# Patient Record
Sex: Female | Born: 1960 | Race: White | Hispanic: No | Marital: Single | Smoking: Never smoker
Health system: Southern US, Community
[De-identification: ages and names within clinical notes are randomized; demographics above are authoritative.]

## PROBLEM LIST (undated history)

## (undated) DIAGNOSIS — D649 Anemia, unspecified: Secondary | ICD-10-CM

## (undated) DIAGNOSIS — R569 Unspecified convulsions: Secondary | ICD-10-CM

## (undated) HISTORY — DX: Anemia, unspecified: D64.9

## (undated) HISTORY — DX: Unspecified convulsions: R56.9

## (undated) HISTORY — PX: ABDOMINAL HYSTERECTOMY: SHX81

---

## 2014-12-28 ENCOUNTER — Ambulatory Visit (INDEPENDENT_AMBULATORY_CARE_PROVIDER_SITE_OTHER): Payer: Medicare Other | Admitting: Emergency Medicine

## 2014-12-28 VITALS — BP 118/80 | HR 73 | Temp 97.7°F | Resp 18 | Ht 63.5 in | Wt 175.0 lb

## 2014-12-28 DIAGNOSIS — N3090 Cystitis, unspecified without hematuria: Secondary | ICD-10-CM

## 2014-12-28 DIAGNOSIS — R358 Other polyuria: Secondary | ICD-10-CM | POA: Diagnosis not present

## 2014-12-28 DIAGNOSIS — E119 Type 2 diabetes mellitus without complications: Secondary | ICD-10-CM | POA: Diagnosis not present

## 2014-12-28 DIAGNOSIS — R3589 Other polyuria: Secondary | ICD-10-CM

## 2014-12-28 DIAGNOSIS — R112 Nausea with vomiting, unspecified: Secondary | ICD-10-CM | POA: Diagnosis not present

## 2014-12-28 LAB — COMPREHENSIVE METABOLIC PANEL
ALT: 34 U/L — ABNORMAL HIGH (ref 6–29)
AST: 28 U/L (ref 10–35)
Albumin: 4.1 g/dL (ref 3.6–5.1)
Alkaline Phosphatase: 98 U/L (ref 33–130)
BUN: 8 mg/dL (ref 7–25)
CALCIUM: 9.1 mg/dL (ref 8.6–10.4)
CHLORIDE: 101 mmol/L (ref 98–110)
CO2: 23 mmol/L (ref 20–31)
Creat: 0.71 mg/dL (ref 0.50–1.05)
GLUCOSE: 137 mg/dL — AB (ref 65–99)
POTASSIUM: 4.1 mmol/L (ref 3.5–5.3)
Sodium: 134 mmol/L — ABNORMAL LOW (ref 135–146)
TOTAL PROTEIN: 7.2 g/dL (ref 6.1–8.1)
Total Bilirubin: 0.7 mg/dL (ref 0.2–1.2)

## 2014-12-28 LAB — POCT GLYCOSYLATED HEMOGLOBIN (HGB A1C): HEMOGLOBIN A1C: 8.6

## 2014-12-28 LAB — POCT CBC
Granulocyte percent: 55 %G (ref 37–80)
HEMATOCRIT: 44.8 % (ref 37.7–47.9)
HEMOGLOBIN: 14.3 g/dL (ref 12.2–16.2)
Lymph, poc: 3.2 (ref 0.6–3.4)
MCH: 27.7 pg (ref 27–31.2)
MCHC: 31.9 g/dL (ref 31.8–35.4)
MCV: 86.7 fL (ref 80–97)
MID (cbc): 0.5 (ref 0–0.9)
MPV: 8.3 fL (ref 0–99.8)
POC GRANULOCYTE: 4.5 (ref 2–6.9)
POC LYMPH PERCENT: 39 %L (ref 10–50)
POC MID %: 6 % (ref 0–12)
Platelet Count, POC: 218 10*3/uL (ref 142–424)
RBC: 5.16 M/uL (ref 4.04–5.48)
RDW, POC: 13.3 %
WBC: 8.2 10*3/uL (ref 4.6–10.2)

## 2014-12-28 LAB — POC MICROSCOPIC URINALYSIS (UMFC): MUCUS RE: ABSENT

## 2014-12-28 LAB — POCT URINALYSIS DIP (MANUAL ENTRY)
Bilirubin, UA: NEGATIVE
Glucose, UA: NEGATIVE
NITRITE UA: NEGATIVE
Spec Grav, UA: >=1.03
Urobilinogen, UA: 0.2
pH, UA: 5.5

## 2014-12-28 LAB — GLUCOSE, POCT (MANUAL RESULT ENTRY): POC Glucose: 145 mg/dl — AB (ref 70–99)

## 2014-12-28 MED ORDER — PHENAZOPYRIDINE HCL 200 MG PO TABS: 200.0000 mg | ORAL_TABLET | Freq: Three times a day (TID) | ORAL | Status: DC | PRN

## 2014-12-28 MED ORDER — METFORMIN HCL ER 500 MG PO TB24: ORAL_TABLET | ORAL | Status: DC

## 2014-12-28 MED ORDER — BLOOD GLUCOSE MONITOR KIT: PACK | Status: DC

## 2014-12-28 MED ORDER — SULFAMETHOXAZOLE-TRIMETHOPRIM 800-160 MG PO TABS: 1.0000 | ORAL_TABLET | Freq: Two times a day (BID) | ORAL | Status: DC

## 2014-12-28 NOTE — Progress Notes (Signed)
Subjective:  Patient ID: Wanda Hunter, female    DOB: July 10, 1960  Age: 54 y.o. MRN: [id number]  CC: Emesis and Nausea   HPI Wanda Hunter presents  she has nausea and vomiting for the last week. Had one episode of hematemesis. No fever or chills. No stool change. No ill contacts. Does have some minor diarrhea and dysuria urgency and frequency of headaches. She been afflicted with chills  History Wanda Hunter has a past medical history of Anemia and Seizures.   She has past surgical history that includes Cesarean section and Abdominal hysterectomy.   Her  family history includes Cancer in her father; Heart disease in her maternal grandmother; Hyperlipidemia in her father and mother; Hypertension in her father and mother; Stroke in her maternal grandmother.  She   reports that she has never smoked. She does not have any smokeless tobacco history on file. She reports that she does not drink alcohol or use illicit drugs.  No outpatient prescriptions prior to visit.   No facility-administered medications prior to visit.    Social History   Social History  . Marital Status: Single    Spouse Name: N/A  . Number of Children: N/A  . Years of Education: N/A   Social History Main Topics  . Smoking status: Never Smoker   . Smokeless tobacco: None  . Alcohol Use: No  . Drug Use: No  . Sexual Activity: Not Asked   Other Topics Concern  . None   Social History Narrative  . None     Review of Systems  Constitutional: Positive for chills. Negative for fever and appetite change.  HENT: Negative for congestion, ear pain, postnasal drip, sinus pressure and sore throat.   Eyes: Negative for pain and redness.  Respiratory: Negative for cough, shortness of breath and wheezing.   Cardiovascular: Negative for leg swelling.  Gastrointestinal: Positive for nausea, vomiting and diarrhea. Negative for abdominal pain, constipation and blood in stool.  Endocrine: Negative for  polyuria.  Genitourinary: Positive for dysuria, urgency and frequency. Negative for flank pain.  Musculoskeletal: Negative for gait problem.  Skin: Negative for rash.  Neurological: Positive for headaches. Negative for weakness.  Psychiatric/Behavioral: Negative for confusion and decreased concentration. The patient is not nervous/anxious.     Objective:  BP 118/80 mmHg  Pulse 73  Temp(Src) 97.7 F (36.5 C) (Oral)  Resp 18  Ht 5' 3.5" (1.613 m)  Wt 175 lb (79.379 kg)  BMI 30.51 kg/m2  SpO2 98%  Physical Exam  Constitutional: She is oriented to person, place, and time. She appears well-developed and well-nourished. No distress.  HENT:  Head: Normocephalic and atraumatic.  Right Ear: External ear normal.  Left Ear: External ear normal.  Nose: Nose normal.  Eyes: Conjunctivae and EOM are normal. Pupils are equal, round, and reactive to light. No scleral icterus.  Neck: Normal range of motion. Neck supple. No tracheal deviation present.  Cardiovascular: Normal rate, regular rhythm and normal heart sounds.   Pulmonary/Chest: Effort normal. No respiratory distress. She has no wheezes. She has no rales.  Abdominal: She exhibits no mass. There is no tenderness. There is no rebound and no guarding.  Musculoskeletal: She exhibits no edema.  Lymphadenopathy:    She has no cervical adenopathy.  Neurological: She is alert and oriented to person, place, and time. Coordination normal.  Skin: Skin is warm and dry. No rash noted.  Psychiatric: She has a normal mood and affect. Her behavior is normal.  Assessment & Plan:   Wanda Hunter was seen today for emesis and nausea.  Diagnoses and all orders for this visit:  Non-intractable vomiting with nausea, vomiting of unspecified type -     POCT CBC -     Comprehensive metabolic panel -     POCT urinalysis dipstick -     POCT Microscopic Urinalysis ([facility]) -     POCT glucose (manual entry)  Polyuria   Wanda Hunter does not  currently have medications on file.  No orders of the defined types were placed in this encounter.    Appropriate red flag conditions were discussed with the patient as well as actions that should be taken.  Patient expressed his understanding.  Follow-up: No Follow-up on file.  Carmelina Dane, MD   Results for orders placed or performed in visit on 12/28/14  POCT CBC  Result Value Ref Range   WBC 8.2 4.6 - 10.2 K/uL   Lymph, poc 3.2 0.6 - 3.4   POC LYMPH PERCENT 39.0 10 - 50 %L   MID (cbc) 0.5 0 - 0.9   POC MID % 6.0 0 - 12 %M   POC Granulocyte 4.5 2 - 6.9   Granulocyte percent 55.0 37 - 80 %G   RBC 5.16 4.04 - 5.48 M/uL   Hemoglobin 14.3 12.2 - 16.2 g/dL   HCT, POC 16.1 09.6 - 47.9 %   MCV 86.7 80 - 97 fL   MCH, POC 27.7 27 - 31.2 pg   MCHC 31.9 31.8 - 35.4 g/dL   RDW, POC 04.5 %   Platelet Count, POC 218 142 - 424 K/uL   MPV 8.3 0 - 99.8 fL  POCT urinalysis dipstick  Result Value Ref Range   Color, UA yellow yellow   Clarity, UA cloudy (A) clear   Glucose, UA negative negative   Bilirubin, UA negative negative   Ketones, POC UA trace (5) (A) negative   Spec Grav, UA >=1.030    Blood, UA moderate (A) negative   pH, UA 5.5    Protein Ur, POC =100 (A) negative   Urobilinogen, UA 0.2    Nitrite, UA Negative Negative   Leukocytes, UA small (1+) (A) Negative  POCT Microscopic Urinalysis ([facility])  Result Value Ref Range   WBC,UR,HPF,POC Many (A) None WBC/hpf   RBC,UR,HPF,POC Moderate (A) None RBC/hpf   Bacteria Moderate (A) None   Mucus Absent Absent   Epithelial Cells, UR Per Microscopy Moderate (A) None cells/hpf  POCT glucose (manual entry)  Result Value Ref Range   POC Glucose 145 (A) 70 - 99 mg/dl  POCT glycosylated hemoglobin (Hb A1C)  Result Value Ref Range   Hemoglobin A1C 8.6

## 2014-12-28 NOTE — Patient Instructions (Signed)

## 2014-12-29 DIAGNOSIS — G4700 Insomnia, unspecified: Secondary | ICD-10-CM | POA: Diagnosis not present

## 2014-12-29 DIAGNOSIS — R42 Dizziness and giddiness: Secondary | ICD-10-CM | POA: Diagnosis not present

## 2014-12-29 DIAGNOSIS — R51 Headache: Secondary | ICD-10-CM | POA: Diagnosis not present

## 2014-12-29 DIAGNOSIS — G40119 Localization-related (focal) (partial) symptomatic epilepsy and epileptic syndromes with simple partial seizures, intractable, without status epilepticus: Secondary | ICD-10-CM | POA: Diagnosis not present

## 2014-12-31 ENCOUNTER — Other Ambulatory Visit: Payer: Self-pay

## 2014-12-31 MED ORDER — BLOOD GLUCOSE MONITOR KIT: PACK | Status: DC

## 2014-12-31 MED ORDER — LANCET DEVICE MISC: Status: DC

## 2014-12-31 MED ORDER — GLUCOSE BLOOD VI STRP: ORAL_STRIP | Status: DC

## 2015-01-04 ENCOUNTER — Telehealth: Payer: Self-pay

## 2015-01-04 MED ORDER — METFORMIN HCL ER 500 MG PO TB24: 500.0000 mg | ORAL_TABLET | Freq: Two times a day (BID) | ORAL | Status: DC

## 2015-01-04 MED ORDER — SULFAMETHOXAZOLE-TRIMETHOPRIM 800-160 MG PO TABS: 1.0000 | ORAL_TABLET | Freq: Two times a day (BID) | ORAL | Status: DC

## 2015-01-04 MED ORDER — PHENAZOPYRIDINE HCL 200 MG PO TABS: 200.0000 mg | ORAL_TABLET | Freq: Three times a day (TID) | ORAL | Status: DC | PRN
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
## 2015-01-04 NOTE — Telephone Encounter (Signed)
Bactrim,  Pyridium, and Metformin Rx's. Please advise.

## 2015-01-04 NOTE — Telephone Encounter (Signed)
Pt advised message from [employer].

## 2015-01-04 NOTE — Telephone Encounter (Signed)
That's terrible! I provided patient's refill. I did make a change to her Metformin. She is to take 1 tab twice daily with meals. Patient should come back for follow up in 3 months. Please let her know she should make an appointment.

## 2015-01-05 ENCOUNTER — Other Ambulatory Visit: Payer: Self-pay

## 2015-01-05 ENCOUNTER — Telehealth: Payer: Self-pay

## 2015-01-05 MED ORDER — METFORMIN HCL ER 500 MG PO TB24: 500.0000 mg | ORAL_TABLET | Freq: Two times a day (BID) | ORAL | Status: DC

## 2015-01-05 NOTE — Telephone Encounter (Signed)
Medications were sent yesterday. I thought Wanda Hunter spoke with her. Called pt to let her know. Left message.

## 2015-01-05 NOTE — Telephone Encounter (Signed)
Pt is checking on status of replacement of her diabetes medication

## 2015-01-16 DIAGNOSIS — L03811 Cellulitis of head [any part, except face]: Secondary | ICD-10-CM | POA: Diagnosis not present

## 2015-01-16 DIAGNOSIS — R220 Localized swelling, mass and lump, head: Secondary | ICD-10-CM | POA: Diagnosis not present

## 2015-03-27 ENCOUNTER — Emergency Department (HOSPITAL_COMMUNITY): Payer: Medicare Other

## 2015-03-27 ENCOUNTER — Encounter (HOSPITAL_COMMUNITY): Payer: Self-pay | Admitting: Emergency Medicine

## 2015-03-27 ENCOUNTER — Emergency Department (HOSPITAL_COMMUNITY)
Admission: EM | Admit: 2015-03-27 | Discharge: 2015-03-27 | Disposition: A | Discharge status: Home / Self Care | Payer: Medicare Other | Attending: Emergency Medicine | Admitting: Emergency Medicine

## 2015-03-27 DIAGNOSIS — S3991XA Unspecified injury of abdomen, initial encounter: Secondary | ICD-10-CM | POA: Diagnosis not present

## 2015-03-27 DIAGNOSIS — S0990XA Unspecified injury of head, initial encounter: Secondary | ICD-10-CM | POA: Diagnosis not present

## 2015-03-27 DIAGNOSIS — S0003XA Contusion of scalp, initial encounter: Secondary | ICD-10-CM | POA: Diagnosis not present

## 2015-03-27 DIAGNOSIS — E119 Type 2 diabetes mellitus without complications: Secondary | ICD-10-CM | POA: Insufficient documentation

## 2015-03-27 DIAGNOSIS — Y92410 Unspecified street and highway as the place of occurrence of the external cause: Secondary | ICD-10-CM | POA: Diagnosis not present

## 2015-03-27 DIAGNOSIS — S40022A Contusion of left upper arm, initial encounter: Secondary | ICD-10-CM | POA: Diagnosis not present

## 2015-03-27 DIAGNOSIS — Y9389 Activity, other specified: Secondary | ICD-10-CM | POA: Insufficient documentation

## 2015-03-27 DIAGNOSIS — S199XXA Unspecified injury of neck, initial encounter: Secondary | ICD-10-CM | POA: Diagnosis present

## 2015-03-27 DIAGNOSIS — S5012XA Contusion of left forearm, initial encounter: Secondary | ICD-10-CM | POA: Insufficient documentation

## 2015-03-27 DIAGNOSIS — Y998 Other external cause status: Secondary | ICD-10-CM | POA: Insufficient documentation

## 2015-03-27 DIAGNOSIS — M542 Cervicalgia: Secondary | ICD-10-CM | POA: Diagnosis not present

## 2015-03-27 DIAGNOSIS — Z862 Personal history of diseases of the blood and blood-forming organs and certain disorders involving the immune mechanism: Secondary | ICD-10-CM | POA: Insufficient documentation

## 2015-03-27 DIAGNOSIS — T148 Other injury of unspecified body region: Secondary | ICD-10-CM | POA: Diagnosis not present

## 2015-03-27 DIAGNOSIS — R1030 Lower abdominal pain, unspecified: Secondary | ICD-10-CM | POA: Diagnosis not present

## 2015-03-27 DIAGNOSIS — M545 Low back pain: Secondary | ICD-10-CM | POA: Diagnosis not present

## 2015-03-27 DIAGNOSIS — S3992XA Unspecified injury of lower back, initial encounter: Secondary | ICD-10-CM | POA: Insufficient documentation

## 2015-03-27 DIAGNOSIS — R1032 Left lower quadrant pain: Secondary | ICD-10-CM | POA: Diagnosis not present

## 2015-03-27 DIAGNOSIS — S1083XA Contusion of other specified part of neck, initial encounter: Secondary | ICD-10-CM | POA: Diagnosis not present

## 2015-03-27 DIAGNOSIS — T1490XA Injury, unspecified, initial encounter: Secondary | ICD-10-CM

## 2015-03-27 LAB — COMPREHENSIVE METABOLIC PANEL
ALK PHOS: 93 U/L (ref 38–126)
ALT: 36 U/L (ref 14–54)
AST: 30 U/L (ref 15–41)
Albumin: 3.4 g/dL — ABNORMAL LOW (ref 3.5–5.0)
Anion gap: 9 (ref 5–15)
BUN: 15 mg/dL (ref 6–20)
CALCIUM: 9 mg/dL (ref 8.9–10.3)
CO2: 23 mmol/L (ref 22–32)
CREATININE: 0.79 mg/dL (ref 0.44–1.00)
Chloride: 106 mmol/L (ref 101–111)
Glucose, Bld: 238 mg/dL — ABNORMAL HIGH (ref 65–99)
Potassium: 4.1 mmol/L (ref 3.5–5.1)
Sodium: 138 mmol/L (ref 135–145)
Total Bilirubin: 0.7 mg/dL (ref 0.3–1.2)
Total Protein: 6.8 g/dL (ref 6.5–8.1)

## 2015-03-27 LAB — APTT: aPTT: 30 seconds (ref 24–37)

## 2015-03-27 LAB — PROTIME-INR
INR: 1.14 (ref 0.00–1.49)
Prothrombin Time: 14.8 seconds (ref 11.6–15.2)

## 2015-03-27 LAB — SAMPLE TO BLOOD BANK

## 2015-03-27 LAB — CBC
HCT: 41.6 % (ref 36.0–46.0)
Hemoglobin: 13.7 g/dL (ref 12.0–15.0)
MCH: 29 pg (ref 26.0–34.0)
MCHC: 32.9 g/dL (ref 30.0–36.0)
MCV: 87.9 fL (ref 78.0–100.0)
PLATELETS: 237 10*3/uL (ref 150–400)
RBC: 4.73 MIL/uL (ref 3.87–5.11)
RDW: 12.4 % (ref 11.5–15.5)
WBC: 15.2 10*3/uL — AB (ref 4.0–10.5)

## 2015-03-27 LAB — I-STAT CREATININE, ED: Creatinine, Ser: 0.7 mg/dL (ref 0.44–1.00)

## 2015-03-27 LAB — ETHANOL

## 2015-03-27 MED ORDER — FENTANYL CITRATE (PF) 100 MCG/2ML IJ SOLN
50.0000 ug | INTRAMUSCULAR | Status: DC | PRN
  Administered 2015-03-27: 50 ug via INTRAVENOUS
  Filled 2015-03-27: qty 2

## 2015-03-27 MED ORDER — SODIUM CHLORIDE 0.9 % IV BOLUS (SEPSIS)
1000.0000 mL | Freq: Once | INTRAVENOUS | Status: AC
  Administered 2015-03-27: 1000 mL via INTRAVENOUS

## 2015-03-27 MED ORDER — ONDANSETRON HCL 4 MG/2ML IJ SOLN: 4.0000 mg | INTRAMUSCULAR | Status: DC | PRN

## 2015-03-27 MED ORDER — SODIUM CHLORIDE 0.9 % IV BOLUS (SEPSIS): 1000.0000 mL | Freq: Once | INTRAVENOUS | Status: DC

## 2015-03-27 MED ORDER — IOHEXOL 300 MG/ML  SOLN
50.0000 mL | Freq: Once | INTRAMUSCULAR | Status: AC | PRN
Start: 2015-03-27 — End: 2015-03-27
  Administered 2015-03-27: 50 mL via INTRAVENOUS

## 2015-03-27 MED ORDER — IOHEXOL 300 MG/ML  SOLN
100.0000 mL | Freq: Once | INTRAMUSCULAR | Status: AC | PRN
  Administered 2015-03-27: 100 mL via INTRAVENOUS
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
## 2015-03-27 NOTE — ED Notes (Signed)
Neg FAST exam
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
## 2015-03-27 NOTE — Discharge Instructions (Signed)

## 2015-03-27 NOTE — ED Notes (Signed)
Patient walked to bathroom to clean up. Paper scrubs given to patient

## 2015-03-27 NOTE — ED Notes (Signed)
Pt ambulatory to restroom with steady gait and no complaints.

## 2015-05-20 ENCOUNTER — Encounter: Payer: Self-pay | Admitting: Family Medicine

## 2015-05-20 ENCOUNTER — Ambulatory Visit (INDEPENDENT_AMBULATORY_CARE_PROVIDER_SITE_OTHER): Payer: Medicare Other | Admitting: Family Medicine

## 2015-05-20 VITALS — BP 114/80 | HR 76 | Temp 97.4°F | Resp 16 | Ht 63.5 in | Wt 169.6 lb

## 2015-05-20 DIAGNOSIS — M25619 Stiffness of unspecified shoulder, not elsewhere classified: Secondary | ICD-10-CM | POA: Diagnosis not present

## 2015-05-20 DIAGNOSIS — E041 Nontoxic single thyroid nodule: Secondary | ICD-10-CM | POA: Diagnosis not present

## 2015-05-20 DIAGNOSIS — R5382 Chronic fatigue, unspecified: Secondary | ICD-10-CM | POA: Diagnosis not present

## 2015-05-20 DIAGNOSIS — E1165 Type 2 diabetes mellitus with hyperglycemia: Secondary | ICD-10-CM | POA: Diagnosis not present

## 2015-05-20 DIAGNOSIS — S161XXS Strain of muscle, fascia and tendon at neck level, sequela: Secondary | ICD-10-CM | POA: Diagnosis not present

## 2015-05-20 DIAGNOSIS — G44329 Chronic post-traumatic headache, not intractable: Secondary | ICD-10-CM | POA: Diagnosis not present

## 2015-05-20 DIAGNOSIS — H539 Unspecified visual disturbance: Secondary | ICD-10-CM | POA: Diagnosis not present

## 2015-05-20 LAB — POCT GLYCOSYLATED HEMOGLOBIN (HGB A1C): Hemoglobin A1C: 9

## 2015-05-20 LAB — POCT URINALYSIS DIP (MANUAL ENTRY)
Bilirubin, UA: NEGATIVE
Glucose, UA: >=1000 — AB
Ketones, POC UA: NEGATIVE
Nitrite, UA: NEGATIVE
Protein Ur, POC: NEGATIVE
Spec Grav, UA: 1.025
Urobilinogen, UA: 0.2
pH, UA: 5.5

## 2015-05-20 MED ORDER — BLOOD GLUCOSE MONITOR KIT: PACK | Status: AC

## 2015-05-20 MED ORDER — METFORMIN HCL ER 500 MG PO TB24: 1000.0000 mg | ORAL_TABLET | Freq: Every day | ORAL | Status: AC

## 2015-05-20 MED ORDER — METHOCARBAMOL 500 MG PO TABS: 500.0000 mg | ORAL_TABLET | Freq: Four times a day (QID) | ORAL | Status: AC | PRN

## 2015-05-20 MED ORDER — LANCET DEVICE MISC: Status: AC

## 2015-05-20 MED ORDER — INDOMETHACIN 50 MG PO CAPS: 50.0000 mg | ORAL_CAPSULE | Freq: Three times a day (TID) | ORAL | Status: AC | PRN

## 2015-05-20 MED ORDER — GLUCOSE BLOOD VI STRP: ORAL_STRIP | Status: AC

## 2015-05-20 NOTE — Patient Instructions (Signed)
Do not use indomethocin with any other otc pain medication other than tylenol/acetaminophen - so no aleve, ibuprofen, motrin, advil, etc. for 24 hours after taking it.   Cervical Strain and Sprain With Rehab Cervical strain and sprain are injuries that commonly occur with "whiplash" injuries. Whiplash occurs when the neck is forcefully whipped backward or forward, such as during a motor vehicle accident or during contact sports. The muscles, ligaments, tendons, discs, and nerves of the neck are susceptible to injury when this occurs. RISK FACTORS Risk of having a whiplash injury increases if:  Osteoarthritis of the spine.  Situations that make head or neck accidents or trauma more likely.  High-risk sports (football, rugby, wrestling, hockey, auto racing, gymnastics, diving, contact karate, or boxing).  Poor strength and flexibility of the neck.  Previous neck injury.  Poor tackling technique.  Improperly fitted or padded equipment. SYMPTOMS   Pain or stiffness in the front or back of neck or both.  Symptoms may present immediately or up to 24 hours after injury.  Dizziness, headache, nausea, and vomiting.  Muscle spasm with soreness and stiffness in the neck.  Tenderness and swelling at the injury site. PREVENTION  Learn and use proper technique (avoid tackling with the head, spearing, and head-butting; use proper falling techniques to avoid landing on the head).  Warm up and stretch properly before activity.  Maintain physical fitness:  Strength, flexibility, and endurance.  Cardiovascular fitness.  Wear properly fitted and padded protective equipment, such as padded soft collars, for participation in contact sports. PROGNOSIS  Recovery from cervical strain and sprain injuries is dependent on the extent of the injury. These injuries are usually curable in 1 week to 3 months with appropriate treatment.  RELATED COMPLICATIONS   Temporary numbness and weakness may occur  if the nerve roots are damaged, and this may persist until the nerve has completely healed.  Chronic pain due to frequent recurrence of symptoms.  Prolonged healing, especially if activity is resumed too soon (before complete recovery). TREATMENT  Treatment initially involves the use of ice and medication to help reduce pain and inflammation. It is also important to perform strengthening and stretching exercises and modify activities that worsen symptoms so the injury does not get worse. These exercises may be performed at home or with a therapist. For patients who experience severe symptoms, a soft, padded collar may be recommended to be worn around the neck.  Improving your posture may help reduce symptoms. Posture improvement includes pulling your chin and abdomen in while sitting or standing. If you are sitting, sit in a firm chair with your buttocks against the back of the chair. While sleeping, try replacing your pillow with a small towel rolled to 2 inches in diameter, or use a cervical pillow or soft cervical collar. Poor sleeping positions delay healing.  For patients with nerve root damage, which causes numbness or weakness, the use of a cervical traction apparatus may be recommended. Surgery is rarely necessary for these injuries. However, cervical strain and sprains that are present at birth (congenital) may require surgery. MEDICATION   If pain medication is necessary, nonsteroidal anti-inflammatory medications, such as aspirin and ibuprofen, or other minor pain relievers, such as acetaminophen, are often recommended.  Do not take pain medication for 7 days before surgery.  Prescription pain relievers may be given if deemed necessary by your caregiver. Use only as directed and only as much as you need. HEAT AND COLD:   Cold treatment (icing) relieves pain and  reduces inflammation. Cold treatment should be applied for 10 to 15 minutes every 2 to 3 hours for inflammation and pain and  immediately after any activity that aggravates your symptoms. Use ice packs or an ice massage.  Heat treatment may be used prior to performing the stretching and strengthening activities prescribed by your caregiver, physical therapist, or athletic trainer. Use a heat pack or a warm soak. SEEK MEDICAL CARE IF:   Symptoms get worse or do not improve in 2 weeks despite treatment.  New, unexplained symptoms develop (drugs used in treatment may produce side effects). EXERCISES RANGE OF MOTION (ROM) AND STRETCHING EXERCISES - Cervical Strain and Sprain These exercises may help you when beginning to rehabilitate your injury. In order to successfully resolve your symptoms, you must improve your posture. These exercises are designed to help reduce the forward-head and rounded-shoulder posture which contributes to this condition. Your symptoms may resolve with or without further involvement from your physician, physical therapist or athletic trainer. While completing these exercises, remember:   Restoring tissue flexibility helps normal motion to return to the joints. This allows healthier, less painful movement and activity.  An effective stretch should be held for at least 20 seconds, although you may need to begin with shorter hold times for comfort.  A stretch should never be painful. You should only feel a gentle lengthening or release in the stretched tissue. STRETCH- Axial Extensors  Lie on your back on the floor. You may bend your knees for comfort. Place a rolled-up hand towel or dish towel, about 2 inches in diameter, under the part of your head that makes contact with the floor.  Gently tuck your chin, as if trying to make a "double chin," until you feel a gentle stretch at the base of your head.  Hold __________ seconds. Repeat __________ times. Complete this exercise __________ times per day.  STRETCH - Axial Extension   Stand or sit on a firm surface. Assume a good posture: chest up,  shoulders drawn back, abdominal muscles slightly tense, knees unlocked (if standing) and feet hip width apart.  Slowly retract your chin so your head slides back and your chin slightly lowers. Continue to look straight ahead.  You should feel a gentle stretch in the back of your head. Be certain not to feel an aggressive stretch since this can cause headaches later.  Hold for __________ seconds. Repeat __________ times. Complete this exercise __________ times per day. STRETCH - Cervical Side Bend   Stand or sit on a firm surface. Assume a good posture: chest up, shoulders drawn back, abdominal muscles slightly tense, knees unlocked (if standing) and feet hip width apart.  Without letting your nose or shoulders move, slowly tip your right / left ear to your shoulder until your feel a gentle stretch in the muscles on the opposite side of your neck.  Hold __________ seconds. Repeat __________ times. Complete this exercise __________ times per day. STRETCH - Cervical Rotators   Stand or sit on a firm surface. Assume a good posture: chest up, shoulders drawn back, abdominal muscles slightly tense, knees unlocked (if standing) and feet hip width apart.  Keeping your eyes level with the ground, slowly turn your head until you feel a gentle stretch along the back and opposite side of your neck.  Hold __________ seconds. Repeat __________ times. Complete this exercise __________ times per day. RANGE OF MOTION - Neck Circles   Stand or sit on a firm surface. Assume a good posture:  chest up, shoulders drawn back, abdominal muscles slightly tense, knees unlocked (if standing) and feet hip width apart.  Gently roll your head down and around from the back of one shoulder to the back of the other. The motion should never be forced or painful.  Repeat the motion 10-20 times, or until you feel the neck muscles relax and loosen. Repeat __________ times. Complete the exercise __________ times per  day. STRENGTHENING EXERCISES - Cervical Strain and Sprain These exercises may help you when beginning to rehabilitate your injury. They may resolve your symptoms with or without further involvement from your physician, physical therapist, or athletic trainer. While completing these exercises, remember:   Muscles can gain both the endurance and the strength needed for everyday activities through controlled exercises.  Complete these exercises as instructed by your physician, physical therapist, or athletic trainer. Progress the resistance and repetitions only as guided.  You may experience muscle soreness or fatigue, but the pain or discomfort you are trying to eliminate should never worsen during these exercises. If this pain does worsen, stop and make certain you are following the directions exactly. If the pain is still present after adjustments, discontinue the exercise until you can discuss the trouble with your clinician. STRENGTH - Cervical Flexors, Isometric  Face a wall, standing about 6 inches away. Place a small pillow, a ball about 6-8 inches in diameter, or a folded towel between your forehead and the wall.  Slightly tuck your chin and gently push your forehead into the soft object. Push only with mild to moderate intensity, building up tension gradually. Keep your jaw and forehead relaxed.  Hold 10 to 20 seconds. Keep your breathing relaxed.  Release the tension slowly. Relax your neck muscles completely before you start the next repetition. Repeat __________ times. Complete this exercise __________ times per day. STRENGTH- Cervical Lateral Flexors, Isometric   Stand about 6 inches away from a wall. Place a small pillow, a ball about 6-8 inches in diameter, or a folded towel between the side of your head and the wall.  Slightly tuck your chin and gently tilt your head into the soft object. Push only with mild to moderate intensity, building up tension gradually. Keep your jaw and  forehead relaxed.  Hold 10 to 20 seconds. Keep your breathing relaxed.  Release the tension slowly. Relax your neck muscles completely before you start the next repetition. Repeat __________ times. Complete this exercise __________ times per day. STRENGTH - Cervical Extensors, Isometric   Stand about 6 inches away from a wall. Place a small pillow, a ball about 6-8 inches in diameter, or a folded towel between the back of your head and the wall.  Slightly tuck your chin and gently tilt your head back into the soft object. Push only with mild to moderate intensity, building up tension gradually. Keep your jaw and forehead relaxed.  Hold 10 to 20 seconds. Keep your breathing relaxed.  Release the tension slowly. Relax your neck muscles completely before you start the next repetition. Repeat __________ times. Complete this exercise __________ times per day. POSTURE AND BODY MECHANICS CONSIDERATIONS - Cervical Strain and Sprain Keeping correct posture when sitting, standing or completing your activities will reduce the stress put on different body tissues, allowing injured tissues a chance to heal and limiting painful experiences. The following are general guidelines for improved posture. Your physician or physical therapist will provide you with any instructions specific to your needs. While reading these guidelines, remember:  The exercises  prescribed by your provider will help you have the flexibility and strength to maintain correct postures.  The correct posture provides the optimal environment for your joints to work. All of your joints have less wear and tear when properly supported by a spine with good posture. This means you will experience a healthier, less painful body.  Correct posture must be practiced with all of your activities, especially prolonged sitting and standing. Correct posture is as important when doing repetitive low-stress activities (typing) as it is when doing a single  heavy-load activity (lifting). PROLONGED STANDING WHILE SLIGHTLY LEANING FORWARD When completing a task that requires you to lean forward while standing in one place for a long time, place either foot up on a stationary 2- to 4-inch high object to help maintain the best posture. When both feet are on the ground, the low back tends to lose its slight inward curve. If this curve flattens (or becomes too large), then the back and your other joints will experience too much stress, fatigue more quickly, and can cause pain.  RESTING POSITIONS Consider which positions are most painful for you when choosing a resting position. If you have pain with flexion-based activities (sitting, bending, stooping, squatting), choose a position that allows you to rest in a less flexed posture. You would want to avoid curling into a fetal position on your side. If your pain worsens with extension-based activities (prolonged standing, working overhead), avoid resting in an extended position such as sleeping on your stomach. Most people will find more comfort when they rest with their spine in a more neutral position, neither too rounded nor too arched. Lying on a non-sagging bed on your side with a pillow between your knees, or on your back with a pillow under your knees will often provide some relief. Keep in mind, being in any one position for a prolonged period of time, no matter how correct your posture, can still lead to stiffness. WALKING Walk with an upright posture. Your ears, shoulders, and hips should all line up. OFFICE WORK When working at a desk, create an environment that supports good, upright posture. Without extra support, muscles fatigue and lead to excessive strain on joints and other tissues. CHAIR:  A chair should be able to slide under your desk when your back makes contact with the back of the chair. This allows you to work closely.  The chair's height should allow your eyes to be level with the upper  part of your monitor and your hands to be slightly lower than your elbows.  Body position:  Your feet should make contact with the floor. If this is not possible, use a foot rest.  Keep your ears over your shoulders. This will reduce stress on your neck and low back.   This information is not intended to replace advice given to you by your health care provider. Make sure you discuss any questions you have with your health care provider.   Document Released: 03/20/2005 Document Revised: 04/10/2014 Document Reviewed: 07/02/2008 Elsevier Interactive Patient Education [employer]
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
## 2015-05-21 LAB — THYROID PANEL WITH TSH
FREE THYROXINE INDEX: 2.4 (ref 1.4–3.8)
T3 UPTAKE: 26 % (ref 22–35)
T4 TOTAL: 9.1 ug/dL (ref 4.5–12.0)
TSH: 0.73 mIU/L

## 2015-05-21 LAB — COMPREHENSIVE METABOLIC PANEL
ALBUMIN: 3.7 g/dL (ref 3.6–5.1)
ALT: 25 U/L (ref 6–29)
AST: 20 U/L (ref 10–35)
Alkaline Phosphatase: 96 U/L (ref 33–130)
BUN: 15 mg/dL (ref 7–25)
CALCIUM: 8.7 mg/dL (ref 8.6–10.4)
CHLORIDE: 105 mmol/L (ref 98–110)
CO2: 24 mmol/L (ref 20–31)
Creat: 0.63 mg/dL (ref 0.50–1.05)
Glucose, Bld: 189 mg/dL — ABNORMAL HIGH (ref 65–99)
POTASSIUM: 4.1 mmol/L (ref 3.5–5.3)
Sodium: 138 mmol/L (ref 135–146)
Total Bilirubin: 0.3 mg/dL (ref 0.2–1.2)
Total Protein: 6.8 g/dL (ref 6.1–8.1)

## 2015-05-21 LAB — MICROALBUMIN / CREATININE URINE RATIO
CREATININE, URINE: 138 mg/dL (ref 20–320)
MICROALB/CREAT RATIO: 14 ug/mg{creat} (ref <30)
Microalb, Ur: 1.9 mg/dL

## 2015-05-23 ENCOUNTER — Encounter: Payer: Self-pay | Admitting: Family Medicine
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
